# Patient Record
Sex: Male | Born: 1980 | Race: White | Hispanic: No
Health system: Southern US, Community
[De-identification: ages and names within clinical notes are randomized; demographics above are authoritative.]

---

## 2017-01-23 ENCOUNTER — Emergency Department (HOSPITAL_COMMUNITY)

## 2017-01-23 ENCOUNTER — Emergency Department (HOSPITAL_COMMUNITY)
Admission: EM | Admit: 2017-01-23 | Discharge: 2017-01-25 | Disposition: A | Discharge status: Home / Self Care | Attending: Emergency Medicine | Admitting: Emergency Medicine

## 2017-01-23 DIAGNOSIS — R4182 Altered mental status, unspecified: Secondary | ICD-10-CM | POA: Diagnosis not present

## 2017-01-23 DIAGNOSIS — R45851 Suicidal ideations: Secondary | ICD-10-CM | POA: Insufficient documentation

## 2017-01-23 DIAGNOSIS — F1014 Alcohol abuse with alcohol-induced mood disorder: Secondary | ICD-10-CM | POA: Diagnosis present

## 2017-01-23 DIAGNOSIS — F4312 Post-traumatic stress disorder, chronic: Secondary | ICD-10-CM | POA: Diagnosis present

## 2017-01-23 DIAGNOSIS — F329 Major depressive disorder, single episode, unspecified: Secondary | ICD-10-CM | POA: Diagnosis present

## 2017-01-23 LAB — RAPID URINE DRUG SCREEN, HOSP PERFORMED
Amphetamines: NOT DETECTED
BARBITURATES: NOT DETECTED
BENZODIAZEPINES: NOT DETECTED
COCAINE: NOT DETECTED
OPIATES: NOT DETECTED
Tetrahydrocannabinol: NOT DETECTED

## 2017-01-23 LAB — CBC WITH DIFFERENTIAL/PLATELET
BASOS ABS: 0.1 10*3/uL (ref 0.0–0.1)
Basophils Relative: 1 %
Eosinophils Absolute: 0.1 10*3/uL (ref 0.0–0.7)
Eosinophils Relative: 1 %
HEMATOCRIT: 49 % (ref 39.0–52.0)
Hemoglobin: 17.4 g/dL — ABNORMAL HIGH (ref 13.0–17.0)
LYMPHS PCT: 43 %
Lymphs Abs: 1.5 10*3/uL (ref 0.7–4.0)
MCH: 29.8 pg (ref 26.0–34.0)
MCHC: 35.5 g/dL (ref 30.0–36.0)
MCV: 84 fL (ref 78.0–100.0)
Monocytes Absolute: 0.4 10*3/uL (ref 0.1–1.0)
Monocytes Relative: 11 %
NEUTROS ABS: 1.5 10*3/uL — AB (ref 1.7–7.7)
Neutrophils Relative %: 44 %
PLATELETS: 340 10*3/uL (ref 150–400)
RBC: 5.83 MIL/uL — AB (ref 4.22–5.81)
RDW: 12.8 % (ref 11.5–15.5)
WBC: 3.5 10*3/uL — AB (ref 4.0–10.5)

## 2017-01-23 LAB — BASIC METABOLIC PANEL
ANION GAP: 13 (ref 5–15)
BUN: 11 mg/dL (ref 6–20)
CO2: 28 mmol/L (ref 22–32)
Calcium: 8.9 mg/dL (ref 8.9–10.3)
Chloride: 101 mmol/L (ref 101–111)
Creatinine, Ser: 0.85 mg/dL (ref 0.61–1.24)
GFR calc Af Amer: >60 mL/min (ref >60)
GLUCOSE: 106 mg/dL — AB (ref 65–99)
POTASSIUM: 3.5 mmol/L (ref 3.5–5.1)
Sodium: 142 mmol/L (ref 135–145)

## 2017-01-23 LAB — ETHANOL: Alcohol, Ethyl (B): 379 mg/dL (ref <10)

## 2017-01-23 MED ORDER — LORAZEPAM 2 MG/ML IJ SOLN: 0.0000 mg | Freq: Two times a day (BID) | INTRAMUSCULAR | Status: DC

## 2017-01-23 MED ORDER — LORAZEPAM 2 MG/ML IJ SOLN: 0.0000 mg | Freq: Four times a day (QID) | INTRAMUSCULAR | Status: DC

## 2017-01-23 MED ORDER — LORAZEPAM 1 MG PO TABS
0.0000 mg | ORAL_TABLET | Freq: Four times a day (QID) | ORAL | Status: DC
  Administered 2017-01-23: 2 mg via ORAL
  Filled 2017-01-23: qty 2

## 2017-01-23 MED ORDER — LORAZEPAM 1 MG PO TABS
0.0000 mg | ORAL_TABLET | Freq: Four times a day (QID) | ORAL | Status: DC
  Administered 2017-01-24 (×2): 2 mg via ORAL
  Administered 2017-01-24 – 2017-01-25 (×2): 1 mg via ORAL
  Filled 2017-01-23: qty 2
  Filled 2017-01-23 (×2): qty 1
  Filled 2017-01-23: qty 2

## 2017-01-23 MED ORDER — THIAMINE HCL 100 MG/ML IJ SOLN: 100.0000 mg | Freq: Every day | INTRAMUSCULAR | Status: DC

## 2017-01-23 MED ORDER — VITAMIN B-1 100 MG PO TABS
100.0000 mg | ORAL_TABLET | Freq: Every day | ORAL | Status: DC
  Administered 2017-01-23 – 2017-01-25 (×3): 100 mg via ORAL
  Filled 2017-01-23 (×3): qty 1

## 2017-01-23 MED ORDER — SODIUM CHLORIDE 0.9 % IV BOLUS (SEPSIS)
1000.0000 mL | Freq: Once | INTRAVENOUS | Status: AC
  Administered 2017-01-23: 1000 mL via INTRAVENOUS

## 2017-01-23 MED ORDER — LORAZEPAM 1 MG PO TABS
0.0000 mg | ORAL_TABLET | Freq: Two times a day (BID) | ORAL | Status: DC
Start: 2017-01-26 — End: 2017-01-25

## 2017-01-23 NOTE — ED Notes (Signed)
EKG RATE OF 123

## 2017-01-23 NOTE — Progress Notes (Signed)
Patient tells me he "is crazy and wants to know what we see on the scan." [place] "[place] told [place]"; he sees things and he hears voices. He quit taking his meds because the voices told him to stop. He said that everyone thinks he is crazy because he is crazy.  I told him that he just needs help and that when they give him medications that he should take them. I then asked the patient if he needed anything while I was in his room and he wanted juice, any kind. I relayed that information to his nurse.
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
## 2017-01-23 NOTE — ED Notes (Signed)
Bed: WA27 Expected date:  Expected time:  Means of arrival:  Comments: [employer] IVC 

## 2017-01-23 NOTE — ED Notes (Signed)
TTS PRESENT SPEAKING WITH PT 

## 2017-01-23 NOTE — ED Notes (Signed)
Patient transported to CT 

## 2017-01-23 NOTE — ED Notes (Signed)
[facility] CELL # [phone]

## 2017-01-23 NOTE — ED Notes (Signed)
ED Provider at bedside. EDP ALLEN 

## 2017-01-23 NOTE — ED Provider Notes (Signed)
Pt alert per nursing and ready for tts eval   Lorre NickAllen, Juanmanuel Marohl, MD 01/23/17 1956

## 2017-01-23 NOTE — ED Notes (Signed)
SBAR Report received from previous nurse. Pt receivedon unit. Pt denies current, A/V H, depression, anxiety, or pain at this time, and appears otherwise stable and ciwa performed. Pt reminded of camera surveillance, q 15 min rounds, and rules of the milieu. Will continue to assess.

## 2017-01-23 NOTE — ED Triage Notes (Signed)
Per paperwork, states landlord took out paperwork due to patient acting erratic-states something bad is going to happen-patient is disheveled and appears to be responding to stimuli-no medical history, unsure of psych history

## 2017-01-23 NOTE — ED Provider Notes (Signed)
[facility]-EMERGENCY DEPT Provider Note   CSN: [id number] Arrival date & time: 01/23/17  1235     History   Chief Complaint Chief Complaint  Patient presents with  . IVC    HPI [place] Jarrett is a 37 y.o. male.  Patient is a 37 year old male with no significant past medical history who takes no medications being brought in under IVC taken out by his landlord.  Landlord states that he went over today to fix some things in the home because he was getting ready to sell it when he noticed that the patient was not acting right.  Per his report this is not been their interaction in the past.  Patient was disheveled and talking about things that did not make a lot of sense.  Officers states that there is not any furniture in his house other than a caught in a computer but his clothes were clean and hanging in the closet.  They did not see any food around but he did have electricity.  Patient just keeps saying in a whisper that something bad is going to happen and this is all getting and badly.  When I asked the patient if he is having any auditory or visual hallucinations he states we do not need to talk about it.  He denies SI or HI.  He is intermittently very jumpy and every sound seems to startle him.  He states that he is scared and needs to calm down.  He also says he has not slept in years.  Occasionally he will try melatonin but he does not take any prescription medications.  He denies any drugs or alcohol.  He has been living in [place] for approximately 6 months and is not sure what he is going to do since the landlord is selling the home.  He says he feels very stressed.   The history is provided by the patient and the police.    No past medical history on file.  There are no active problems to display for this patient.    Home Medications    Prior to Admission medications   Not on File    Family History No family history on file.  Social  History Social History   Tobacco Use  . Smoking status: Not on file  Substance Use Topics  . Alcohol use: Not on file  . Drug use: Not on file     Allergies   Patient has no allergy information on record.   Review of Systems Review of Systems  All other systems reviewed and are negative.    Physical Exam Updated Vital Signs BP 106/62 (BP Location: Right Arm)   Pulse 68   Temp 99 F (37.2 C) (Oral)   Resp 18   Ht 5\' 6"  (1.676 m)   Wt 63.5 kg (140 lb)   SpO2 99%   BMI 22.60 kg/m   Physical Exam  Constitutional: He is oriented to person, place, and time. He appears well-developed and well-nourished. No distress.  HENT:  Head: Normocephalic and atraumatic.  Mouth/Throat: Oropharynx is clear and moist.  Eyes: Conjunctivae and EOM are normal. Pupils are equal, round, and reactive to light.  Neck: Normal range of motion. Neck supple.  Cardiovascular: Regular rhythm and intact distal pulses. Tachycardia present.  No murmur heard. Pulmonary/Chest: Effort normal and breath sounds normal. No respiratory distress. He has no wheezes. He has no rales.  Abdominal: Soft. He exhibits no distension. There is no tenderness. There  is no rebound and no guarding.  Musculoskeletal: Normal range of motion. He exhibits no edema or tenderness.  Neurological: He is alert and oriented to person, place, and time. No cranial nerve deficit or sensory deficit.  5 out of 5 strength in upper and lower extremities bilaterally.  Gait normal.  Skin: Skin is warm and dry. No rash noted. No erythema.  Psychiatric: He expresses no homicidal and no suicidal ideation.  Patient has a very bizarre affect.  He will occasionally speak in a normal tone but then starts whispering as if he is responding to internal stimuli.  He is very jumpy and seems nervous.  Patient was unable to figure out how to put his pants on and needed help dressing.  However when he came he was dressed appropriately but his clothes were  disheveled  Nursing note and vitals reviewed.    ED Treatments / Results  Labs (all labs ordered are listed, but only abnormal results are displayed) Labs Reviewed  CBC WITH DIFFERENTIAL/PLATELET - Abnormal; Notable for the following components:      Result Value   WBC 3.5 (*)    RBC 5.83 (*)    Hemoglobin 17.4 (*)    Neutro Abs 1.5 (*)    All other components within normal limits  BASIC METABOLIC PANEL - Abnormal; Notable for the following components:   Glucose, Bld 106 (*)    All other components within normal limits  ETHANOL - Abnormal; Notable for the following components:   Alcohol, Ethyl (B) 379 (*)    All other components within normal limits  RAPID URINE DRUG SCREEN, HOSP PERFORMED    EKG  EKG Interpretation  Date/Time:  Friday January 23 2017 13:24:56 EST Ventricular Rate:  123 PR Interval:  130 QRS Duration: 84 QT Interval:  326 QTC Calculation: 466 R Axis:   16 Text Interpretation:  Sinus tachycardia Otherwise normal ECG No previous tracing Confirmed by Gwyneth SproutPlunkett, Jayelle Page ([id number]) on 01/23/2017 1:55:23 PM       Radiology Ct Head Wo Contrast  Result Date: 01/23/2017 CLINICAL DATA:  Erratically behavior. Possible psychiatric diagnosis. EXAM: CT HEAD WITHOUT CONTRAST TECHNIQUE: Contiguous axial images were obtained from the base of the skull through the vertex without intravenous contrast. COMPARISON:  None. FINDINGS: Brain: The brain shows a normal appearance without evidence of malformation, atrophy, old or acute small or large vessel infarction, mass lesion, hemorrhage, hydrocephalus or extra-axial collection. Vascular: No hyperdense vessel. No evidence of atherosclerotic calcification. Skull: Normal.  No traumatic finding.  No focal bone lesion. Sinuses/Orbits: Sinuses are clear. Orbits appear normal. Mastoids are clear. Other: None significant IMPRESSION: Normal head CT Electronically Signed   By: Paulina FusiMark  Shogry M.D.   On: 01/23/2017 15:33     Procedures Procedures (including critical care time)  Medications Ordered in ED Medications - No data to display   Initial Impression / Assessment and Plan / ED Course  I have reviewed the triage vital signs and the nursing notes.  Pertinent labs & imaging results that were available during my care of the patient were reviewed by me and considered in my medical decision making (see chart for details).     Patient presenting today under IVC by his landlord for bizarre behavior.  Patient has no prior visits here and unclear if he has any significant medical problems.  He denies taking any medications regularly. This is most likely psychiatric in nature however we will do a head CT to ensure patient does not have any type  of frontal brain tumor.  Will send medical screening labs but he denies alcohol or drugs.  He does not appear intoxicated but does appear nervous.  Offered patient Ativan which he refused.  2:34 PM Labs consistent with alcohol intoxication with ETOH level of 379.  Other labs o/w wnl.  CT pending.  Will need pt to sober up and re-eval.  3:39 PM CT neg for acute pathology.  Will re-eval when pt is sober  Final Clinical Impressions(s) / ED Diagnoses   Final diagnoses:  None    ED Discharge Orders    None       Gwyneth Sprout, MD 01/23/17 1539

## 2017-01-24 DIAGNOSIS — F1014 Alcohol abuse with alcohol-induced mood disorder: Secondary | ICD-10-CM | POA: Diagnosis present

## 2017-01-24 DIAGNOSIS — F4312 Post-traumatic stress disorder, chronic: Secondary | ICD-10-CM | POA: Diagnosis present

## 2017-01-24 DIAGNOSIS — Z79899 Other long term (current) drug therapy: Secondary | ICD-10-CM

## 2017-01-24 MED ORDER — GABAPENTIN 300 MG PO CAPS
300.0000 mg | ORAL_CAPSULE | Freq: Two times a day (BID) | ORAL | Status: DC
  Administered 2017-01-24 – 2017-01-25 (×3): 300 mg via ORAL
  Filled 2017-01-24 (×3): qty 1

## 2017-01-24 MED ORDER — SERTRALINE HCL 50 MG PO TABS
25.0000 mg | ORAL_TABLET | Freq: Every day | ORAL | Status: DC
  Administered 2017-01-24 – 2017-01-25 (×2): 25 mg via ORAL
  Filled 2017-01-24 (×2): qty 1
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
## 2017-01-24 NOTE — Consult Note (Signed)
[facility] Consult   Reason for Consult: alcohol abuse, bizarre behavior Referring Physician: EDP Patient Identification: Todd Hardy MRN:  [id number] Principal Diagnosis: Chronic post-traumatic stress disorder (PTSD) Diagnosis:   Patient Active Problem List   Diagnosis Date Noted  . Chronic post-traumatic stress disorder (PTSD) [F43.12] 01/24/2017    Priority: High  . Alcohol abuse with alcohol-induced mood disorder ([place]) [F10.14] 01/24/2017    Priority: High    Total Time spent with patient: 45 minutes  Subjective:   Todd Hardy is a 37 y.o. male patient admitted with bizarre behavior and feeling stressed out .  HPI:  Patient with history of PTSD, Alcohol use disorder severe who was IVC'd by his Landlord due to bizarre behavior and saying "something bad was going to happen". Patient reports that he stopped taking his medication for PTSD because he thought the medication is not working for him due taking excessive Alcohol consumption. Patient did not remember the events that lead to his hospital admission yesterday, all he says was that he was "pretty twisted. 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 Things have not been going well for me.'' Patient appears sad, hopeless, stressed out but denies psychosis, delusions, suicidal or homicidal thoughts.   Past Psychiatric History: as above  Risk to Self: Suicidal Ideation: No(Pt denies.) Suicidal Intent: No Is patient at risk for suicide?: No Suicidal Plan?: No Access to Means: No What has been your use of drugs/alcohol within the last 12 months?: Alochol.  How many times?: 1 Other Self Harm Risks: Pt denies. Triggers for Past Attempts: Unpredictable Intentional Self Injurious Behavior: None(Pt denies, ) Risk to Others: Homicidal Ideation: No(Pt denies.) Thoughts of Harm to Others: No Current Homicidal Intent: No Current Homicidal Plan: No Access to Homicidal  Means: No Identified Victim: NA History of harm to others?: No Assessment of Violence: None Noted Violent Behavior Description: NA Does patient have access to weapons?: No(Pt denies. ) Criminal Charges Pending?: No Does patient have a court date: No Prior Inpatient Therapy: Prior Inpatient Therapy: No Prior Therapy Dates: NA Prior Therapy Facilty/Provider(s): NA Reason for Treatment: NA Prior Outpatient Therapy: Prior Outpatient Therapy: No Prior Therapy Dates: NA Prior Therapy Facilty/Provider(s): NA Reason for Treatment: NA Does patient have an ACCT team?: No Does patient have Intensive In-House Services?  : No Does patient have [facility] services? : No Does patient have P4CC services?: No  Past Medical History: No past medical history on file. The histories are not reviewed yet. Please review them in the "History" navigator section and refresh this [place]. Family History: No family history on file. Family Psychiatric  History:  Social History:  Social History   Substance and Sexual Activity  Alcohol Use Not on file     Social History   Substance and Sexual Activity  Drug Use Not on file    Social History   Socioeconomic History  . Marital status: Unknown    Spouse name: Not on file  . Number of children: Not on file  . Years of education: Not on file  . Highest education level: Not on file  Social Needs  . Financial resource strain: Not on file  . Food insecurity - worry: Not on file  . Food insecurity - inability: Not on file  . Transportation needs - medical: Not on file  . Transportation needs - non-medical: Not on file  Occupational History  . Not on file  Tobacco Use  . Smoking  status: Not on file  Substance and Sexual Activity  . Alcohol use: Not on file  . Drug use: Not on file  . Sexual activity: Not on file  Other Topics Concern  . Not on file  Social History Narrative  . Not on file   Additional Social History:    Allergies:  Not on  File  Labs:  Results for orders placed or performed during the hospital encounter of 01/23/17 (from the past 48 hour(s))  CBC with Differential/Platelet     Status: Abnormal   Collection Time: 01/23/17  1:03 PM  Result Value Ref Range   WBC 3.5 (L) 4.0 - 10.5 K/uL   RBC 5.83 (H) 4.22 - 5.81 MIL/uL   Hemoglobin 17.4 (H) 13.0 - 17.0 g/dL   HCT 49.0 39.0 - 52.0 %   MCV 84.0 78.0 - 100.0 fL   MCH 29.8 26.0 - 34.0 pg   MCHC 35.5 30.0 - 36.0 g/dL   RDW 12.8 11.5 - 15.5 %   Platelets 340 150 - 400 K/uL   Neutrophils Relative % 44 %   Neutro Abs 1.5 (L) 1.7 - 7.7 K/uL   Lymphocytes Relative 43 %   Lymphs Abs 1.5 0.7 - 4.0 K/uL   Monocytes Relative 11 %   Monocytes Absolute 0.4 0.1 - 1.0 K/uL   Eosinophils Relative 1 %   Eosinophils Absolute 0.1 0.0 - 0.7 K/uL   Basophils Relative 1 %   Basophils Absolute 0.1 0.0 - 0.1 K/uL  Basic metabolic panel     Status: Abnormal   Collection Time: 01/23/17  1:03 PM  Result Value Ref Range   Sodium 142 135 - 145 mmol/L   Potassium 3.5 3.5 - 5.1 mmol/L   Chloride 101 101 - 111 mmol/L   CO2 28 22 - 32 mmol/L   Glucose, Bld 106 (H) 65 - 99 mg/dL   BUN 11 6 - 20 mg/dL   Creatinine, Ser 0.85 0.61 - 1.24 mg/dL   Calcium 8.9 8.9 - 10.3 mg/dL   GFR calc non Af Amer >60 >60 mL/min   GFR calc Af Amer >60 >60 mL/min    Comment: (NOTE) The eGFR has been calculated using the CKD EPI equation. This calculation has not been validated in all clinical situations. eGFR's persistently <60 mL/min signify possible Chronic Kidney Disease.    Anion gap 13 5 - 15  Ethanol     Status: Abnormal   Collection Time: 01/23/17  1:03 PM  Result Value Ref Range   Alcohol, Ethyl (B) 379 (HH) <10 mg/dL    Comment:        LOWEST DETECTABLE LIMIT FOR SERUM ALCOHOL IS 10 mg/dL FOR MEDICAL PURPOSES ONLY CRITICAL RESULT CALLED TO, READ BACK BY AND VERIFIED WITH: HANKS,K. RN AT 1358 01/23/17 MULLINS,T   Rapid urine drug screen (hospital performed)     Status: None    Collection Time: 01/23/17  2:21 PM  Result Value Ref Range   Opiates NONE DETECTED NONE DETECTED   Cocaine NONE DETECTED NONE DETECTED   Benzodiazepines NONE DETECTED NONE DETECTED   Amphetamines NONE DETECTED NONE DETECTED   Tetrahydrocannabinol NONE DETECTED NONE DETECTED   Barbiturates NONE DETECTED NONE DETECTED    Comment: (NOTE) DRUG SCREEN FOR MEDICAL PURPOSES ONLY.  IF CONFIRMATION IS NEEDED FOR ANY PURPOSE, NOTIFY LAB WITHIN 5 DAYS. LOWEST DETECTABLE LIMITS FOR URINE DRUG SCREEN Drug Class  Cutoff (ng/mL) Amphetamine and metabolites    1000 Barbiturate and metabolites    200 Benzodiazepine                 737 Tricyclics and metabolites     300 Opiates and metabolites        300 Cocaine and metabolites        300 THC                            50     Current Facility-Administered Medications  Medication Dose Route Frequency Provider Last Rate Last Dose  . gabapentin (NEURONTIN) capsule 300 mg  300 mg Oral BID Rejina Odle, MD      . Derrill Memo ON 01/26/2017] LORazepam (ATIVAN) injection 0-4 mg  0-4 mg Intravenous Q12H Lacretia Leigh, MD       Or  . Derrill Memo ON 01/26/2017] LORazepam (ATIVAN) tablet 0-4 mg  0-4 mg Oral Q12H Lacretia Leigh, MD      . LORazepam (ATIVAN) injection 0-4 mg  0-4 mg Intravenous Q6H Green, Terri L, RPH       Or  . LORazepam (ATIVAN) tablet 0-4 mg  0-4 mg Oral Q6H Green, Terri L, RPH   1 mg at 01/24/17 1032  . sertraline (ZOLOFT) tablet 25 mg  25 mg Oral Daily Ahriana Gunkel, MD      . thiamine (VITAMIN B-1) tablet 100 mg  100 mg Oral Daily Lacretia Leigh, MD   100 mg at 01/24/17 1031   Or  . thiamine (B-1) injection 100 mg  100 mg Intravenous Daily Lacretia Leigh, MD       Current Outpatient Medications  Medication Sig Dispense Refill  . Ginkgo Biloba 40 MG TABS Take 40 mg by mouth daily.    . Inositol 324 MG TABS Take 324 mg by mouth daily.    . Multiple Vitamins-Minerals (CENTRUM SILVER ULTRA MENS PO) Take 1 tablet by  mouth daily.      Musculoskeletal: Strength & Muscle Tone: within normal limits Gait & Station: normal Patient leans: N/A  Psychiatric Specialty Exam: Physical Exam  Psychiatric: Thought content normal. His mood appears anxious. His speech is delayed. He is agitated and combative. Cognition and memory are normal. He expresses impulsivity.    Review of Systems  Constitutional: Positive for malaise/fatigue.  HENT: Negative.   Eyes: Negative.   Respiratory: Negative.   Cardiovascular: Negative.   Gastrointestinal: Positive for nausea and vomiting.  Genitourinary: Negative.   Musculoskeletal: Positive for myalgias.  Skin: Negative.   Neurological: Positive for tremors.  Endo/Heme/Allergies: Negative.   Psychiatric/Behavioral: Positive for substance abuse. The patient is nervous/anxious.     Blood pressure 125/85, pulse 91, temperature 97.7 F (36.5 C), temperature source Oral, resp. rate 18, height 5' 6"  (1.676 m), weight 63.5 kg (140 lb), SpO2 100 %.Body mass index is 22.6 kg/m.  General Appearance: Bizarre  Eye Contact:  Fair  Speech:  Clear and Coherent  Volume:  Decreased  Mood:  Depressed  Affect:  Constricted  Thought Process:  Coherent and Descriptions of Associations: Intact  Orientation:  Full (Time, Place, and Person)  Thought Content:  Logical  Suicidal Thoughts:  No  Homicidal Thoughts:  No  Memory:  Immediate;   Fair Recent;   Fair Remote;   Fair  Judgement:  Poor  Insight:  Shallow  Psychomotor Activity:  Psychomotor Retardation  Concentration:  Concentration: Fair and Attention Span: Fair  Recall:  Fair  Fund of Knowledge:  Fair  Language:  Fair  Akathisia:  No  Handed:  Right  AIMS (if indicated):     Assets:  Communication Skills  ADL's:  Intact  Cognition:  WNL  Sleep:   poor     Treatment Plan Summary: Daily contact with patient to assess and evaluate symptoms and progress in treatment and Medication management   Start Sertrazine 25 mg  daily for PTSD and Gabapentin 300 mg bid for aggressive behavior/alcohol withdrawal  Disposition: Recommend psychiatric Inpatient admission when medically cleared.  Corena Pilgrim, MD 01/24/2017 11:31 AM

## 2017-01-24 NOTE — ED Notes (Signed)
SBAR Report received from previous nurse. Pt received calm and visible on unit. Pt denies current SI/ HI, A/V H, depression, anxiety, or pain at this time, and appears otherwise stable and free of distress. Pt reminded of camera surveillance, q 15 min rounds, and rules of the milieu. Will continue to assess. 

## 2017-01-25 MED ORDER — SERTRALINE HCL 25 MG PO TABS: 25.0000 mg | ORAL_TABLET | Freq: Every day | ORAL | 0 refills | Status: AC

## 2017-01-25 MED ORDER — CLONIDINE HCL 0.1 MG PO TABS
0.1000 mg | ORAL_TABLET | Freq: Once | ORAL | Status: AC
  Administered 2017-01-25: 0.1 mg via ORAL
  Filled 2017-01-25: qty 1

## 2017-01-25 MED ORDER — GABAPENTIN 300 MG PO CAPS: 300.0000 mg | ORAL_CAPSULE | Freq: Three times a day (TID) | ORAL | 0 refills | Status: AC

## 2017-01-25 MED ORDER — GABAPENTIN 300 MG PO CAPS: 300.0000 mg | ORAL_CAPSULE | Freq: Three times a day (TID) | ORAL | Status: DC

## 2017-01-25 NOTE — ED Notes (Signed)
Patient alert and oriented. Patient states he is here for detox. Patient however is expressing he wants to go home today. Patient denies SI/HI and A/V hallucinations. Patient denies  Pain. Patient with Q 15 minute checks in progress and patient remains safe on unit. Monitoring continues.

## 2017-01-25 NOTE — ED Notes (Signed)
Patient discharge home. Patient AVS/Prescriptions reviewed with him and written copy given and patient verbalized understanding. Patient voices no concerns or questions. Patient alert and oriented. All patient belongings returned to patient. Patient vitals at discharge 98.2-129/94-104-18-100% room air. Patient escorted off unit by staff.

## 2017-01-25 NOTE — Progress Notes (Signed)
IVC rescinded at 11:40AM   

## 2017-01-25 NOTE — [facility] Suicide Risk Assessment (Signed)
Suicide Risk Assessment  Discharge Assessment   [facility]   Principal Problem: Alcohol abuse with alcohol-induced mood disorder [facility](HCC) Discharge Diagnoses:  Patient Active Problem List   Diagnosis Date Noted  . Chronic post-traumatic stress disorder (PTSD) [F43.12] 01/24/2017    Priority: High  . Alcohol abuse with alcohol-induced mood disorder ([facility]) [F10.14] 01/24/2017    Priority: High    Total Time spent with patient: 30 minutes  Musculoskeletal: Strength & Muscle Tone: within normal limits Gait & Station: normal Patient leans: N/A  Psychiatric Specialty Exam: Physical Exam  Constitutional: He is oriented to person, place, and time. He appears well-developed and well-nourished.  HENT:  Head: Normocephalic.  Neck: Normal range of motion.  Respiratory: Effort normal.  Musculoskeletal: Normal range of motion.  Neurological: He is alert and oriented to person, place, and time.  Psychiatric: He has a normal mood and affect. His speech is normal and behavior is normal. Judgment and thought content normal. Cognition and memory are normal.    Review of Systems  Psychiatric/Behavioral: Positive for substance abuse.  All other systems reviewed and are negative.   Blood pressure (!) 151/104, pulse 93, temperature 97.9 F (36.6 C), temperature source Oral, resp. rate 20, height 5\' 6"  (1.676 m), weight 63.5 kg (140 lb), SpO2 99 %.Body mass index is 22.6 kg/m.  General Appearance: Casual  Eye Contact:  Good  Speech:  Clear and Coherent  Volume:  Normal  Mood:  Euthymic  Affect:  Congruent  Thought Process:  Coherent and Descriptions of Associations: Intact  Orientation:  Full (Time, Place, and Person)  Thought Content:  WDL and Logical  Suicidal Thoughts:  No  Homicidal Thoughts:  No  Memory:  Immediate;   Good Recent;   Good Remote;   Good  Judgement:  Fair  Insight:  Fair  Psychomotor Activity:  Normal  Concentration:  Concentration: Good and  Attention Span: Good  Recall:  Good  Fund of Knowledge:  Fair  Language:  Good  Akathisia:  No  Handed:  Right  AIMS (if indicated):     Assets:  Housing Leisure Time Physical Health Resilience Social Support  ADL's:  Intact  Cognition:  WNL  Sleep:      Mental Status Per Nursing Assessment::   On Admission:   alcohol intoxication with hallucinations  Demographic Factors:  Male, Caucasian and Living alone  Loss Factors: NA  Historical Factors: NA  Risk Reduction Factors:   Sense of responsibility to family and Positive social support  Continued Clinical Symptoms:  None  Cognitive Features That Contribute To Risk:  None    Suicide Risk:  Minimal: No identifiable suicidal ideation.  Patients presenting with no risk factors but with morbid ruminations; may be classified as minimal risk based on the severity of the depressive symptoms    Plan Of Care/Follow-up recommendations:  Activity:  as tolerated Diet:  heart healthy diet  Yan Pankratz, NP 01/25/2017, 2:24 PM

## 2017-01-25 NOTE — Consult Note (Signed)
[facility] Consult   Reason for Consult:  Alcohol intoxication with hallucinations Referring Physician:  EDP Patient Identification: Todd Hardy MRN:  [id number] Principal Diagnosis: Alcohol abuse with alcohol-induced mood disorder [facility]) Diagnosis:   Patient Active Problem List   Diagnosis Date Noted  . Chronic post-traumatic stress disorder (PTSD) [F43.12] 01/24/2017    Priority: High  . Alcohol abuse with alcohol-induced mood disorder ([place]) [F10.14] 01/24/2017    Priority: High    Total Time spent with patient: 30 minutes  Subjective:   Todd Hardy is a 37 y.o. male patient has stabilized.  HPI:  37 yo male who presented to the ED with alcohol intoxication and hallucinations.  Ativan alcohol detox protocol was started and he stabilized.  No suicidal/homicidal ideations, hallucinations, or withdrawal symptoms today.  He is clear and coherent with no confusion.  Todd reports he will be moving to live with his parents at the end of the month in [place].  Stable for discharge.  Past Psychiatric History: substance abuse, PTSD  Risk to Self: None Risk to Others: Homicidal Ideation: No(Pt denies.) Thoughts of Harm to Others: No Current Homicidal Intent: No Current Homicidal Plan: No Access to Homicidal Means: No Identified Victim: NA History of harm to others?: No Assessment of Violence: None Noted Violent Behavior Description: NA Does patient have access to weapons?: No(Pt denies. ) Criminal Charges Pending?: No Does patient have a court date: No Prior Inpatient Therapy: Prior Inpatient Therapy: No Prior Therapy Dates: NA Prior Therapy Facilty/Provider(s): NA Reason for Treatment: NA Prior Outpatient Therapy: Prior Outpatient Therapy: No Prior Therapy Dates: NA Prior Therapy Facilty/Provider(s): NA Reason for Treatment: NA Does patient have an ACCT team?: No Does patient have Intensive In-House Services?  : No Does patient have [facility] services? : No Does  patient have P4CC services?: No  Past Medical History: No past medical history on file.  Family History: No family history on file. Family Psychiatric  History: none  Social History:  Social History   Substance and Sexual Activity  Alcohol Use Not on file     Social History   Substance and Sexual Activity  Drug Use Not on file    Social History   Socioeconomic History  . Marital status: Unknown    Spouse name: Not on file  . Number of children: Not on file  . Years of education: Not on file  . Highest education level: Not on file  Social Needs  . Financial resource strain: Not on file  . Food insecurity - worry: Not on file  . Food insecurity - inability: Not on file  . Transportation needs - medical: Not on file  . Transportation needs - non-medical: Not on file  Occupational History  . Not on file  Tobacco Use  . Smoking status: Not on file  Substance and Sexual Activity  . Alcohol use: Not on file  . Drug use: Not on file  . Sexual activity: Not on file  Other Topics Concern  . Not on file  Social History Narrative  . Not on file   Additional Social History:    Allergies:  Not on File  Labs:  Results for orders placed or performed during the hospital encounter of 01/23/17 (from the past 48 hour(s))  CBC with Differential/Platelet     Status: Abnormal   Collection Time: 01/23/17  1:03 PM  Result Value Ref Range   WBC 3.5 (L) 4.0 - 10.5 K/uL   RBC 5.83 (H) 4.22 - 5.81 MIL/uL  Hemoglobin 17.4 (H) 13.0 - 17.0 g/dL   HCT 49.0 39.0 - 52.0 %   MCV 84.0 78.0 - 100.0 fL   MCH 29.8 26.0 - 34.0 pg   MCHC 35.5 30.0 - 36.0 g/dL   RDW 12.8 11.5 - 15.5 %   Platelets 340 150 - 400 K/uL   Neutrophils Relative % 44 %   Neutro Abs 1.5 (L) 1.7 - 7.7 K/uL   Lymphocytes Relative 43 %   Lymphs Abs 1.5 0.7 - 4.0 K/uL   Monocytes Relative 11 %   Monocytes Absolute 0.4 0.1 - 1.0 K/uL   Eosinophils Relative 1 %   Eosinophils Absolute 0.1 0.0 - 0.7 K/uL   Basophils  Relative 1 %   Basophils Absolute 0.1 0.0 - 0.1 K/uL  Basic metabolic panel     Status: Abnormal   Collection Time: 01/23/17  1:03 PM  Result Value Ref Range   Sodium 142 135 - 145 mmol/L   Potassium 3.5 3.5 - 5.1 mmol/L   Chloride 101 101 - 111 mmol/L   CO2 28 22 - 32 mmol/L   Glucose, Bld 106 (H) 65 - 99 mg/dL   BUN 11 6 - 20 mg/dL   Creatinine, Ser 0.85 0.61 - 1.24 mg/dL   Calcium 8.9 8.9 - 10.3 mg/dL   GFR calc non Af Amer >60 >60 mL/min   GFR calc Af Amer >60 >60 mL/min    Comment: (NOTE) The eGFR has been calculated using the CKD EPI equation. This calculation has not been validated in all clinical situations. eGFR's persistently <60 mL/min signify possible Chronic Kidney Disease.    Anion gap 13 5 - 15  Ethanol     Status: Abnormal   Collection Time: 01/23/17  1:03 PM  Result Value Ref Range   Alcohol, Ethyl (B) 379 (HH) <10 mg/dL    Comment:        LOWEST DETECTABLE LIMIT FOR SERUM ALCOHOL IS 10 mg/dL FOR MEDICAL PURPOSES ONLY CRITICAL RESULT CALLED TO, READ BACK BY AND VERIFIED WITH: HANKS,K. RN AT 1358 01/23/17 MULLINS,T   Rapid urine drug screen (hospital performed)     Status: None   Collection Time: 01/23/17  2:21 PM  Result Value Ref Range   Opiates NONE DETECTED NONE DETECTED   Cocaine NONE DETECTED NONE DETECTED   Benzodiazepines NONE DETECTED NONE DETECTED   Amphetamines NONE DETECTED NONE DETECTED   Tetrahydrocannabinol NONE DETECTED NONE DETECTED   Barbiturates NONE DETECTED NONE DETECTED    Comment: (NOTE) DRUG SCREEN FOR MEDICAL PURPOSES ONLY.  IF CONFIRMATION IS NEEDED FOR ANY PURPOSE, NOTIFY LAB WITHIN 5 DAYS. LOWEST DETECTABLE LIMITS FOR URINE DRUG SCREEN Drug Class                     Cutoff (ng/mL) Amphetamine and metabolites    1000 Barbiturate and metabolites    200 Benzodiazepine                 664 Tricyclics and metabolites     300 Opiates and metabolites        300 Cocaine and metabolites        300 THC                             50     Current Facility-Administered Medications  Medication Dose Route Frequency Provider Last Rate Last Dose  . gabapentin (NEURONTIN) capsule 300 mg  300 mg Oral BID  Corena Pilgrim, MD   300 mg at 01/25/17 1041  . [START ON 01/26/2017] LORazepam (ATIVAN) injection 0-4 mg  0-4 mg Intravenous Q12H Lacretia Leigh, MD       Or  . Derrill Memo ON 01/26/2017] LORazepam (ATIVAN) tablet 0-4 mg  0-4 mg Oral Q12H Lacretia Leigh, MD      . LORazepam (ATIVAN) injection 0-4 mg  0-4 mg Intravenous Q6H Green, Terri L, RPH       Or  . LORazepam (ATIVAN) tablet 0-4 mg  0-4 mg Oral Q6H Green, Terri L, RPH   2 mg at 01/24/17 2110  . sertraline (ZOLOFT) tablet 25 mg  25 mg Oral Daily Currie Dennin, MD   25 mg at 01/25/17 1041  . thiamine (VITAMIN B-1) tablet 100 mg  100 mg Oral Daily Lacretia Leigh, MD   100 mg at 01/25/17 1041   Or  . thiamine (B-1) injection 100 mg  100 mg Intravenous Daily Lacretia Leigh, MD       Current Outpatient Medications  Medication Sig Dispense Refill  . Ginkgo Biloba 40 MG TABS Take 40 mg by mouth daily.    . Inositol 324 MG TABS Take 324 mg by mouth daily.    . Multiple Vitamins-Minerals (CENTRUM SILVER ULTRA MENS PO) Take 1 tablet by mouth daily.      Musculoskeletal: Strength & Muscle Tone: within normal limits Gait & Station: normal Patient leans: N/A  Psychiatric Specialty Exam: Physical Exam  Constitutional: He is oriented to person, place, and time. He appears well-developed and well-nourished.  HENT:  Head: Normocephalic.  Neck: Normal range of motion.  Respiratory: Effort normal.  Musculoskeletal: Normal range of motion.  Neurological: He is alert and oriented to person, place, and time.  Psychiatric: He has a normal mood and affect. His speech is normal and behavior is normal. Judgment and thought content normal. Cognition and memory are normal.    Review of Systems  Psychiatric/Behavioral: Positive for substance abuse.  All other systems reviewed and are  negative.   Blood pressure (!) 151/104, pulse 93, temperature 97.9 F (36.6 C), temperature source Oral, resp. rate 20, height 5' 6"  (1.676 m), weight 63.5 kg (140 lb), SpO2 99 %.Body mass index is 22.6 kg/m.  General Appearance: Casual  Eye Contact:  Good  Speech:  Clear and Coherent  Volume:  Normal  Mood:  Euthymic  Affect:  Congruent  Thought Process:  Coherent and Descriptions of Associations: Intact  Orientation:  Full (Time, Place, and Person)  Thought Content:  WDL and Logical  Suicidal Thoughts:  No  Homicidal Thoughts:  No  Memory:  Immediate;   Good Recent;   Good Remote;   Good  Judgement:  Fair  Insight:  Fair  Psychomotor Activity:  Normal  Concentration:  Concentration: Good and Attention Span: Good  Recall:  Good  Fund of Knowledge:  Fair  Language:  Good  Akathisia:  No  Handed:  Right  AIMS (if indicated):     Assets:  Housing Leisure Time Physical Health Resilience Social Support  ADL's:  Intact  Cognition:  WNL  Sleep:        Treatment Plan Summary: Daily contact with patient to assess and evaluate symptoms and progress in treatment, Medication management and Plan alcohol abuse with alcohol induced mood disorder:  -Crisis stabilization -Medication management:  Ativan alcohol detox protocol while inpatient, Gabapentin 300 mg BID at discharge to prevent any withdrawal symptoms -Individual and substance abuse counseling -Peer support consult  Disposition: No  evidence of imminent risk to self or others at present.    Waylan Boga, NP 01/25/2017 12:26 PM  Patient seen face-to-face for psychiatric evaluation, chart reviewed and case discussed with the physician extender and developed treatment plan. Reviewed the information documented and agree with the treatment plan. Corena Pilgrim, MD

## 2018-11-15 IMAGING — CT CT HEAD W/O CM
3 series · 16 of 47 positions shown, 19 images · non-contrast
Comparison: None.

CLINICAL DATA: Erratically behavior. Possible psychiatric
diagnosis.

EXAM:
CT HEAD WITHOUT CONTRAST
TECHNIQUE: Contiguous axial images were obtained from the base of the skull
through the vertex without intravenous contrast.

[Series 2: head wo · axial · 0.47mm/px · z∈[-101,+34]mm · 10 of 33 slices shown, 13 images]
[im 3/33  brain]
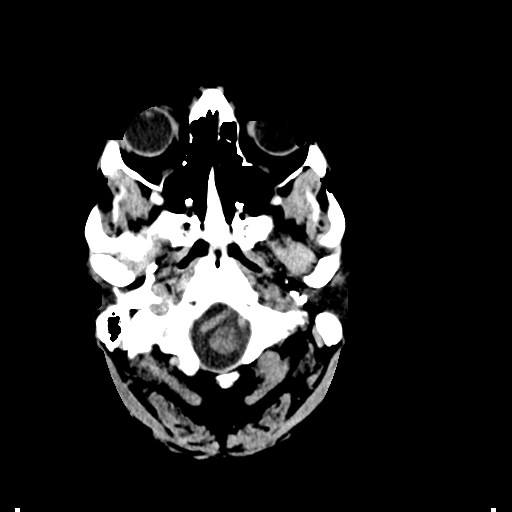
[im 3/33  bone]
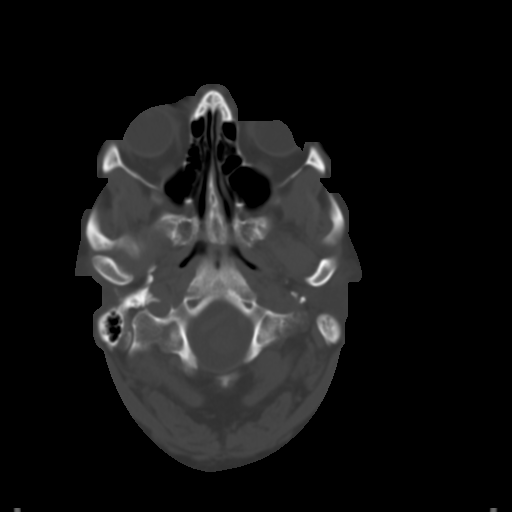
[im 6/33  brain]
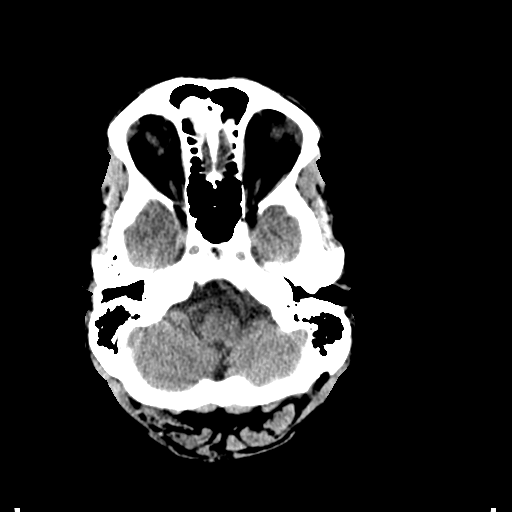
[im 9/33  brain]
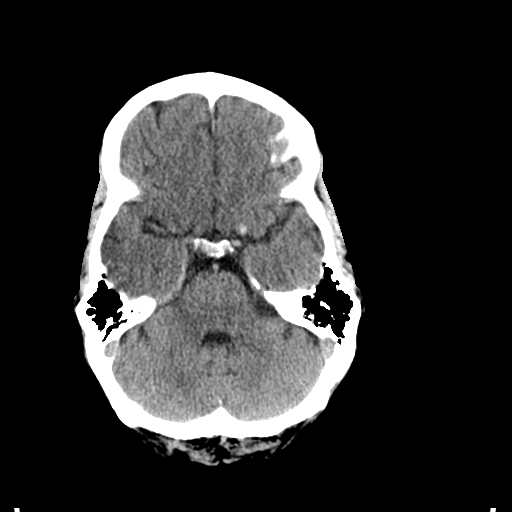
[im 12/33  brain]
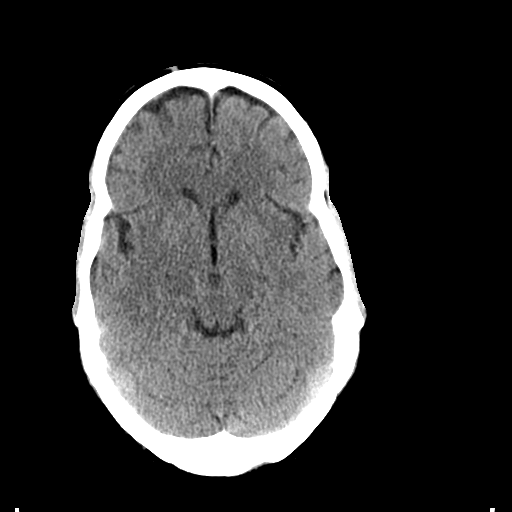
[im 15/33  brain]
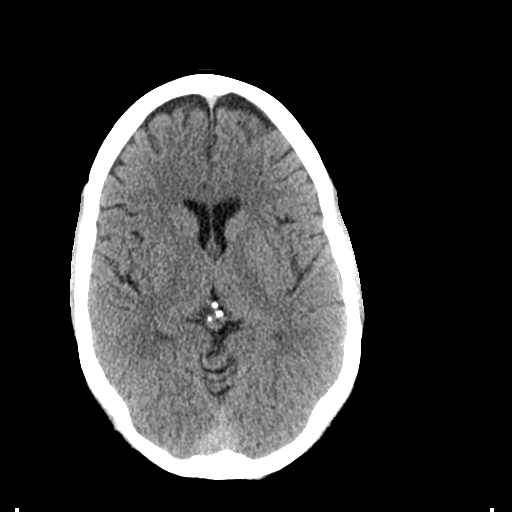
[im 15/33  bone]
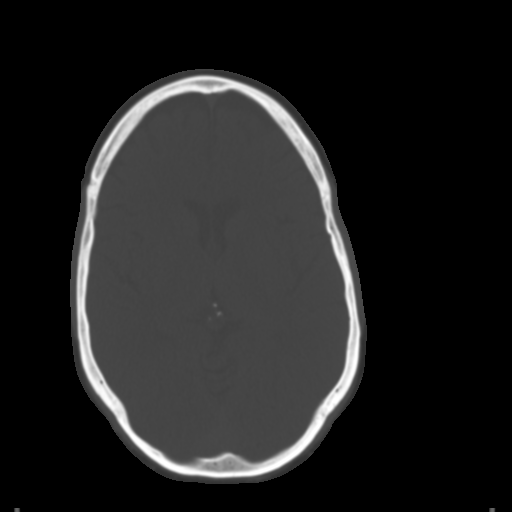
[im 18/33  brain]
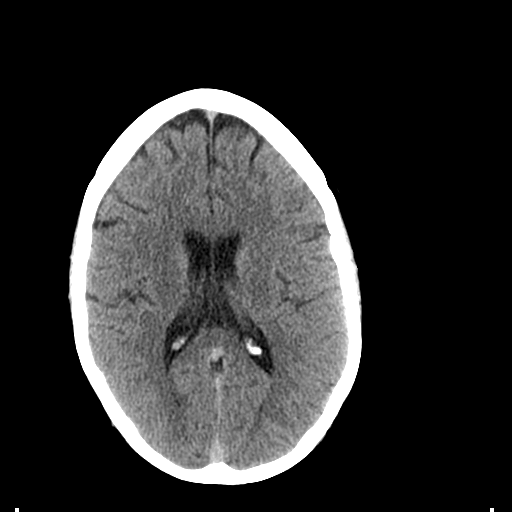
[im 21/33  brain]
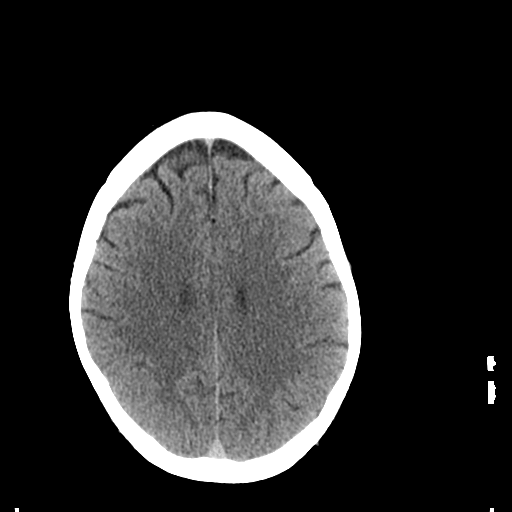
[im 25/33  brain]
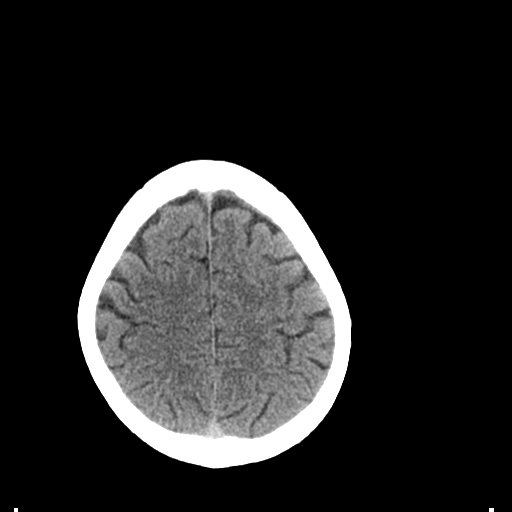
[im 27/33  brain]
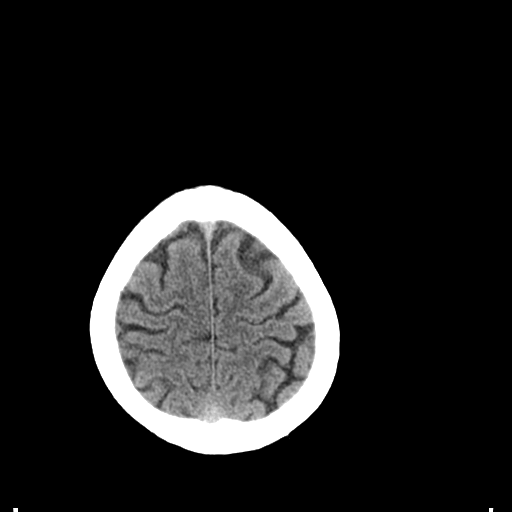
[im 27/33  bone]
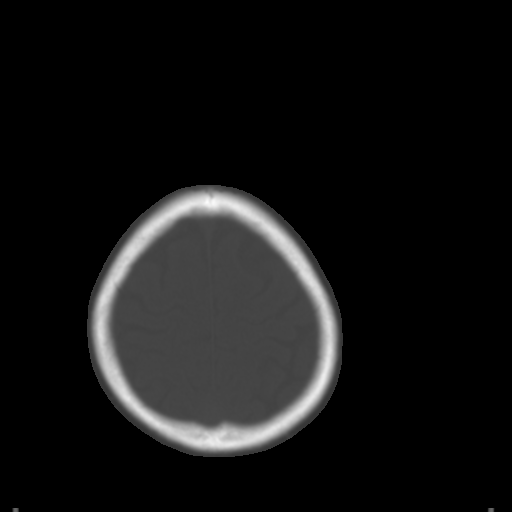
[im 30/33  brain]
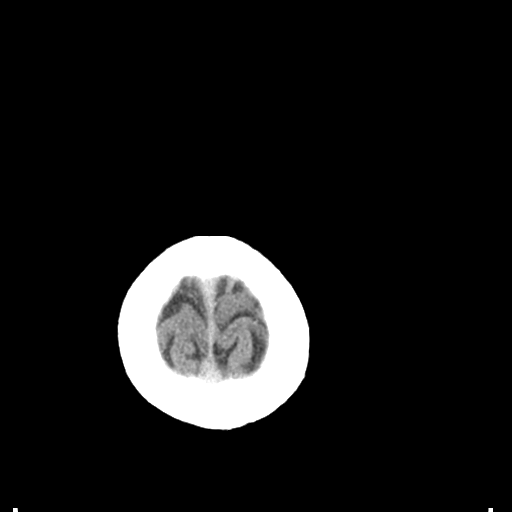

[Series 5: coronal soft tissue · coronal · 0.31mm/px · 3 of 65 slices shown]
[im 22/65  brain]
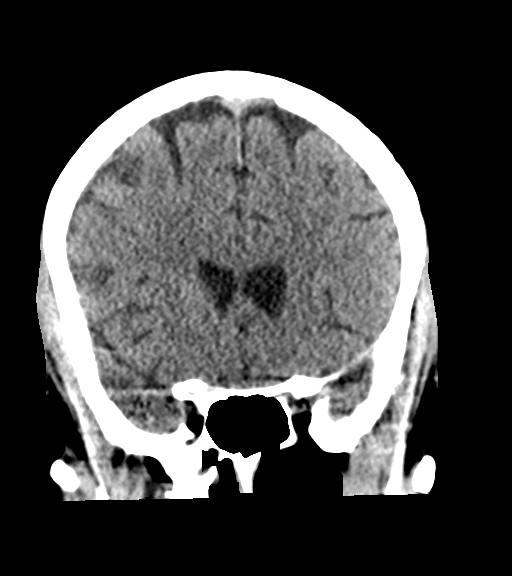
[im 29/65  brain]
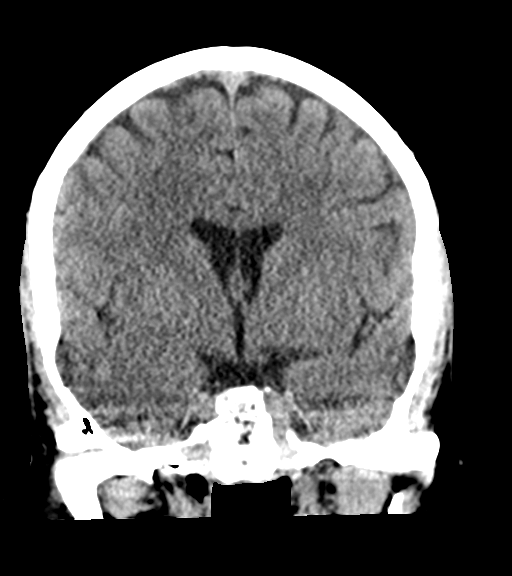
[im 36/65  brain]
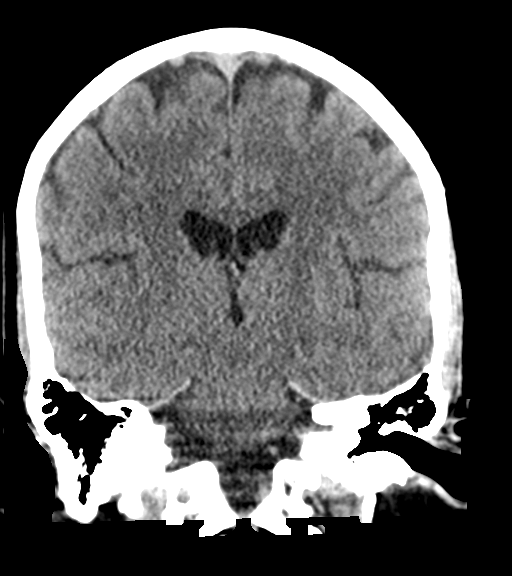

[Series 6: sagittal soft tissue · sagittal · 0.31mm/px · 3 of 50 slices shown]
[im 17/50  brain]
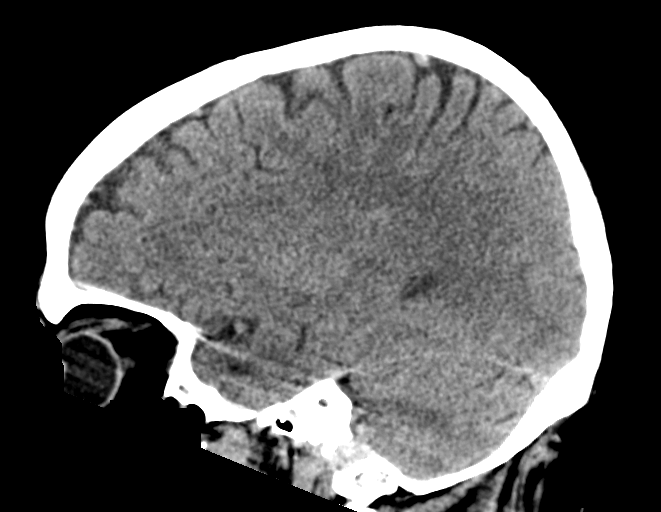
[im 25/50  brain]
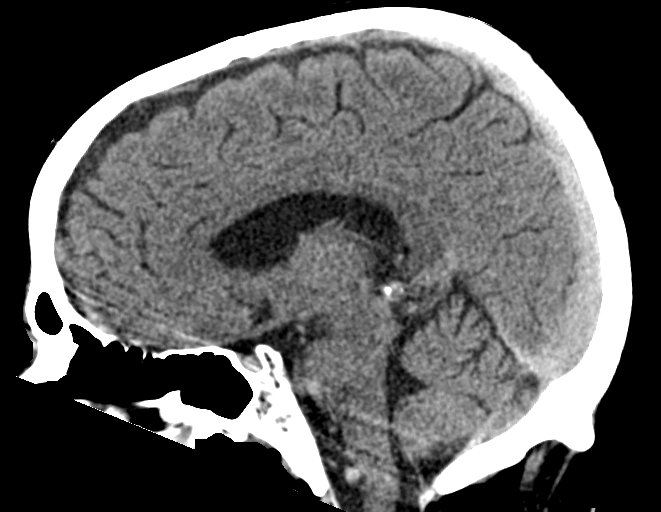
[im 33/50  brain]
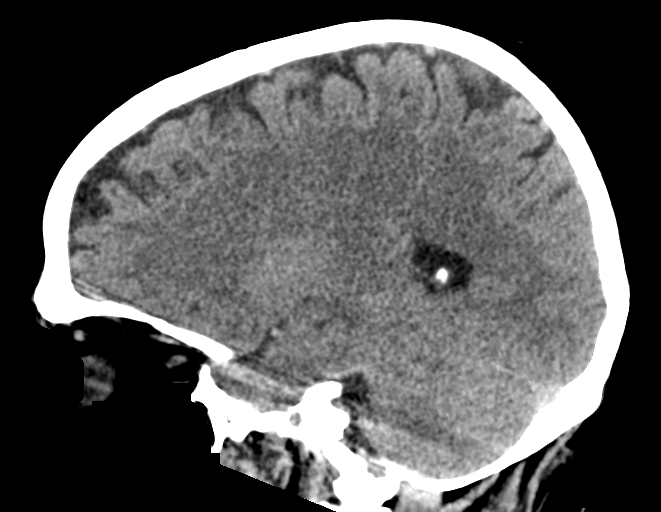

[16 of 47 positions shown; findings below may reference images not displayed]

FINDINGS: Brain: The brain shows a normal appearance without evidence of
malformation, atrophy, old or acute small or large vessel
infarction, mass lesion, hemorrhage, hydrocephalus or extra-axial
collection.

Vascular: No hyperdense vessel. No evidence of atherosclerotic
calcification.

Skull: Normal.  No traumatic finding.  No focal bone lesion.

Sinuses/Orbits: Sinuses are clear. Orbits appear normal. Mastoids
are clear.

Other: None significant
IMPRESSION: Normal head CT
